# Patient Record
Sex: Female | Born: 1960 | Race: White | Hispanic: No | Marital: Married | Smoking: Former smoker
Health system: Southern US, Community
[De-identification: ages and names within clinical notes are randomized; demographics above are authoritative.]

## PROBLEM LIST (undated history)

## (undated) DIAGNOSIS — R918 Other nonspecific abnormal finding of lung field: Secondary | ICD-10-CM

## (undated) DIAGNOSIS — E119 Type 2 diabetes mellitus without complications: Secondary | ICD-10-CM

## (undated) DIAGNOSIS — E785 Hyperlipidemia, unspecified: Secondary | ICD-10-CM

## (undated) DIAGNOSIS — I10 Essential (primary) hypertension: Secondary | ICD-10-CM

## (undated) HISTORY — DX: Essential (primary) hypertension: I10

## (undated) HISTORY — DX: Hyperlipidemia, unspecified: E78.5

## (undated) HISTORY — PX: ECTOPIC PREGNANCY SURGERY: SHX613

## (undated) HISTORY — DX: Type 2 diabetes mellitus without complications: E11.9

---

## 2019-04-05 ENCOUNTER — Other Ambulatory Visit: Payer: Self-pay

## 2019-04-05 ENCOUNTER — Encounter: Payer: Self-pay | Admitting: Pulmonary Disease

## 2019-04-05 ENCOUNTER — Ambulatory Visit (INDEPENDENT_AMBULATORY_CARE_PROVIDER_SITE_OTHER): Payer: BLUE CROSS/BLUE SHIELD | Admitting: Pulmonary Disease

## 2019-04-05 VITALS — BP 130/70 | HR 108 | Temp 98.2°F | Ht 66.0 in | Wt 148.4 lb

## 2019-04-05 DIAGNOSIS — Z720 Tobacco use: Secondary | ICD-10-CM | POA: Diagnosis not present

## 2019-04-05 DIAGNOSIS — R918 Other nonspecific abnormal finding of lung field: Secondary | ICD-10-CM

## 2019-04-05 DIAGNOSIS — R590 Localized enlarged lymph nodes: Secondary | ICD-10-CM

## 2019-04-05 DIAGNOSIS — C3490 Malignant neoplasm of unspecified part of unspecified bronchus or lung: Secondary | ICD-10-CM

## 2019-04-05 LAB — CBC WITH DIFFERENTIAL/PLATELET
Basophils Absolute: 0.1 10*3/uL (ref 0.0–0.1)
Basophils Relative: 0.6 % (ref 0.0–3.0)
Eosinophils Absolute: 0.1 10*3/uL (ref 0.0–0.7)
Eosinophils Relative: 0.5 % (ref 0.0–5.0)
HCT: 36.1 % (ref 36.0–46.0)
Hemoglobin: 12.3 g/dL (ref 12.0–15.0)
Lymphocytes Relative: 19.7 % (ref 12.0–46.0)
Lymphs Abs: 2.5 10*3/uL (ref 0.7–4.0)
MCHC: 34.2 g/dL (ref 30.0–36.0)
MCV: 87.5 fl (ref 78.0–100.0)
Monocytes Absolute: 0.8 10*3/uL (ref 0.1–1.0)
Monocytes Relative: 6.4 % (ref 3.0–12.0)
Neutro Abs: 9.2 10*3/uL — ABNORMAL HIGH (ref 1.4–7.7)
Neutrophils Relative %: 72.8 % (ref 43.0–77.0)
Platelets: 465 10*3/uL — ABNORMAL HIGH (ref 150.0–400.0)
RBC: 4.12 Mil/uL (ref 3.87–5.11)
RDW: 13.6 % (ref 11.5–15.5)
WBC: 12.6 10*3/uL — ABNORMAL HIGH (ref 4.0–10.5)

## 2019-04-05 LAB — COMPREHENSIVE METABOLIC PANEL
ALT: 11 U/L (ref 0–35)
AST: 12 U/L (ref 0–37)
Albumin: 3.9 g/dL (ref 3.5–5.2)
Alkaline Phosphatase: 113 U/L (ref 39–117)
BUN: 18 mg/dL (ref 6–23)
CO2: 25 mEq/L (ref 19–32)
Calcium: 10.2 mg/dL (ref 8.4–10.5)
Chloride: 100 mEq/L (ref 96–112)
Creatinine, Ser: 0.78 mg/dL (ref 0.40–1.20)
GFR: 75.39 mL/min (ref >60.00)
Glucose, Bld: 258 mg/dL — ABNORMAL HIGH (ref 70–99)
Potassium: 4.3 mEq/L (ref 3.5–5.1)
Sodium: 134 mEq/L — ABNORMAL LOW (ref 135–145)
Total Bilirubin: 0.3 mg/dL (ref 0.2–1.2)
Total Protein: 7.9 g/dL (ref 6.0–8.3)

## 2019-04-05 LAB — APTT: aPTT: 34.1 s — ABNORMAL HIGH (ref 23.4–32.7)

## 2019-04-05 LAB — PROTIME-INR
INR: 1.1 ratio — ABNORMAL HIGH (ref 0.8–1.0)
Prothrombin Time: 12.3 s (ref 9.6–13.1)

## 2019-04-05 NOTE — Progress Notes (Signed)
Synopsis: Referred in March 2021 for abnormal CT imaging, lung mass by Timoteo Gaul, FNP  Subjective:   PATIENT ID: Erika Austin GENDER: female DOB: 1960/11/18, MRN: [id number]  Chief Complaint  Patient presents with  . Consult    Patient is here for lung mass. Patient has shortness of breath with exertion, dry/productive cough with white/yellow sputum and has had no voice for about a month.    This is a 59 year old female longstanding history of smoking since teenager, 40 years of smoking 1 pack/day.  Recently over the past couple of months have lost her voice with voice hoarseness.  She had continued weight loss in conjunction of fatigue and loss of appetite.  Patient had CT scan imaging of the chest completed at [facility] ([place]) which revealed a large left upper lobe mass, right hilar mass involving the aortopulmonary window and associated mediastinal adenopathy and enlarged station 7 node.  Patient was referred to pulmonary medicine for evaluation and tissue diagnosis.  She also has plans for referral to Dr. Lavera Guise at [facility].  She has only had CT imaging at this point no PET scan but no MRI of the brain.  She denies hemoptysis.  She does have family history of lung cancer in her father.    Past Medical History:  Diagnosis Date  . Diabetes ([place])   . Hyperlipidemia   . Hypertension      Family History  Problem Relation Age of Onset  . Colon cancer Mother   . Lung cancer Father   . Heart attack Brother      Past Surgical History:  Procedure Laterality Date  . CESAREAN SECTION    . ECTOPIC PREGNANCY SURGERY      Social History   Socioeconomic History  . Marital status: Married    Spouse name: Not on file  . Number of children: Not on file  . Years of education: Not on file  . Highest education level: Not on file  Occupational History  . Not on file  Tobacco Use  . Smoking status: Former Smoker    Packs/day: 1.00    Years: 40.00   Pack years: 40.00    Types: Cigarettes    Quit date: 03/15/2019    Years since quitting: 0.0  . Smokeless tobacco: Never Used  Substance and Sexual Activity  . Alcohol use: Never  . Drug use: Never  . Sexual activity: Not on file  Other Topics Concern  . Not on file  Social History Narrative  . Not on file   Social Determinants of Health   Financial Resource Strain:   . Difficulty of Paying Living Expenses: Not on file  Food Insecurity:   . Worried About Charity fundraiser in the Last Year: Not on file  . Ran Out of Food in the Last Year: Not on file  Transportation Needs:   . Lack of Transportation (Medical): Not on file  . Lack of Transportation (Non-Medical): Not on file  Physical Activity:   . Days of Exercise per Week: Not on file  . Minutes of Exercise per Session: Not on file  Stress:   . Feeling of Stress : Not on file  Social Connections:   . Frequency of Communication with Friends and Family: Not on file  . Frequency of Social Gatherings with Friends and Family: Not on file  . Attends Religious Services: Not on file  . Active Member of Clubs or Organizations: Not on file  . Attends  Club or Organization Meetings: Not on file  . Marital Status: Not on file  Intimate Partner Violence:   . Fear of Current or Ex-Partner: Not on file  . Emotionally Abused: Not on file  . Physically Abused: Not on file  . Sexually Abused: Not on file     Allergies  Allergen Reactions  . Sulfa Antibiotics Swelling    Tongue swelling     Outpatient Medications Prior to Visit  Medication Sig Dispense Refill  . albuterol (VENTOLIN HFA) 108 (90 Base) MCG/ACT inhaler Inhale 1 puff into the lungs as needed.    Marland Kitchen atorvastatin (LIPITOR) 10 MG tablet Take 10 mg by mouth daily.    Marland Kitchen glimepiride (AMARYL) 2 MG tablet Take 2 mg by mouth every morning.    Marland Kitchen lisinopril-hydrochlorothiazide (ZESTORETIC) 10-12.5 MG tablet Take 1 tablet by mouth daily.    . metFORMIN (GLUCOPHAGE) 1000 MG tablet  Take 1,000 mg by mouth 2 (two) times daily.    . metoprolol succinate (TOPROL-XL) 50 MG 24 hr tablet Take 50 mg by mouth daily.     No facility-administered medications prior to visit.    Review of Systems  Constitutional: Negative for chills, fever, malaise/fatigue and weight loss.  HENT: Negative for hearing loss, sore throat and tinnitus.   Eyes: Negative for blurred vision and double vision.  Respiratory: Positive for shortness of breath and wheezing. Negative for cough, hemoptysis, sputum production and stridor.   Cardiovascular: Negative for chest pain, palpitations, orthopnea, leg swelling and PND.  Gastrointestinal: Negative for abdominal pain, constipation, diarrhea, heartburn, nausea and vomiting.  Genitourinary: Negative for dysuria, hematuria and urgency.  Musculoskeletal: Negative for joint pain and myalgias.  Skin: Negative for itching and rash.  Neurological: Positive for weakness. Negative for dizziness, tingling and headaches.  Endo/Heme/Allergies: Negative for environmental allergies. Does not bruise/bleed easily.  Psychiatric/Behavioral: Positive for depression. The patient is not nervous/anxious and does not have insomnia.   All other systems reviewed and are negative.    Objective:  Physical Exam Vitals reviewed.  Constitutional:      General: She is not in acute distress.    Appearance: She is well-developed.  HENT:     Head: Normocephalic and atraumatic.     Mouth/Throat:     Pharynx: No oropharyngeal exudate.  Eyes:     Conjunctiva/sclera: Conjunctivae normal.     Pupils: Pupils are equal, round, and reactive to light.  Neck:     Vascular: No JVD.     Trachea: No tracheal deviation.     Comments: Loss of supraclavicular fat Cardiovascular:     Rate and Rhythm: Normal rate and regular rhythm.     Heart sounds: S1 normal and S2 normal.     Comments: Distant heart tones Pulmonary:     Effort: No tachypnea or accessory muscle usage.     Breath sounds:  No stridor. Decreased breath sounds (throughout all lung fields) present. No wheezing, rhonchi or rales.  Abdominal:     General: Bowel sounds are normal. There is no distension.     Palpations: Abdomen is soft.     Tenderness: There is no abdominal tenderness.  Musculoskeletal:        General: Deformity (muscle wasting ) present.  Skin:    General: Skin is warm and dry.     Capillary Refill: Capillary refill takes less than 2 seconds.     Findings: No rash.  Neurological:     Mental Status: She is alert and oriented to person,  place, and time.  Psychiatric:        Behavior: Behavior normal.      Vitals:   04/05/19 1428  BP: 130/70  Pulse: (!) 108  Temp: 98.2 F (36.8 C)  TempSrc: Temporal  SpO2: 97%  Weight: 148 lb 6.4 oz (67.3 kg)  Height: 5\' 6"  (1.676 m)   97% on RA BMI Readings from Last 3 Encounters:  04/05/19 23.95 kg/m   Wt Readings from Last 3 Encounters:  04/05/19 148 lb 6.4 oz (67.3 kg)     CBC No results found for: WBC, RBC, HGB, HCT, PLT, MCV, MCH, MCHC, RDW, LYMPHSABS, MONOABS, EOSABS, BASOSABS       Chest Imaging: CT Chest [facility]:  Large LUL obstructing mass and lobar segmental collapse, bulky adenopathy in subcarina.  The patient's images have been independently reviewed by me.    Pulmonary Functions Testing Results: No flowsheet data found.     Assessment & Plan:     ICD-10-CM   1. Mass of upper lobe of left lung  R91.8 Ambulatory referral to Pulmonology    Ambulatory referral to Oncology  2. Mediastinal adenopathy  R59.0 Ambulatory referral to Pulmonology    Ambulatory referral to Oncology  3. Tobacco abuse  Z72.0   4. Malignant neoplasm of unspecified part of unspecified bronchus or lung ([place])  C34.90 MR BRAIN W WO CONTRAST    NM PET Image Initial (PI) Skull Base To Thigh    Discussion: This is a 59 year old former smoker quit at the beginning of the month in February.  40-pack-year history found to have a large left upper lobe  mass occluding anterior segment of the left upper lobe/lingula as well as bulky hilar and mediastinal adenopathy concerning for an advanced stage bronchogenic carcinoma.  Plan Following Extensive Data Review & Interpretation:  . I reviewed prior external note(s) from primary care office Nile Riggs, NP . I reviewed the result(s) of CT imaging from [place], office lab work . I have ordered nuclear medicine pet imaging, MRI brain with contrast, referral [facility]  Today in the office we discussed risk benefits and alternatives of proceeding with invasive diagnostic bronchoscopy.  We discussed the risk of bleeding as well as pneumothorax.  We also discussed the possibility of use of cryotherapy with the opening of the left upper lobe.  Independent interpretation of tests . Review of patient's February 2021 chest imaging images revealed large left upper lobe lesion, bulky hilar and mediastinal adenopathy concerning for a advanced age primary bronchogenic carcinoma. The patient's images have been independently reviewed by me.     Garner Nash, DO [facility] 04/05/2019 2:56 PM

## 2019-04-05 NOTE — H&P (View-Only) (Signed)
Synopsis: Referred in March 2021 for abnormal CT imaging, lung mass by Timoteo Gaul, FNP  Subjective:   PATIENT ID: Erika Austin GENDER: female DOB: [birth year], MRN: [id number]  Chief Complaint  Patient presents with  . Consult    Patient is here for lung mass. Patient has shortness of breath with exertion, dry/productive cough with white/yellow sputum and has had no voice for about a month.    This is a 59 year old female longstanding history of smoking since teenager, 40 years of smoking 1 pack/day.  Recently over the past couple of months have lost her voice with voice hoarseness.  She had continued weight loss in conjunction of fatigue and loss of appetite.  Patient had CT scan imaging of the chest completed at [facility] which revealed a large left upper lobe mass, right hilar mass involving the aortopulmonary window and associated mediastinal adenopathy and enlarged station 7 node.  Patient was referred to pulmonary medicine for evaluation and tissue diagnosis.  She also has plans for referral to Dr. Lavera Guise at [facility].  She has only had CT imaging at this point no PET scan but no MRI of the brain.  She denies hemoptysis.  She does have family history of lung cancer in her father.    Past Medical History:  Diagnosis Date  . Diabetes ([place])   . Hyperlipidemia   . Hypertension      Family History  Problem Relation Age of Onset  . Colon cancer Mother   . Lung cancer Father   . Heart attack Brother      Past Surgical History:  Procedure Laterality Date  . CESAREAN SECTION    . ECTOPIC PREGNANCY SURGERY      Social History   Socioeconomic History  . Marital status: Married    Spouse name: Not on file  . Number of children: Not on file  . Years of education: Not on file  . Highest education level: Not on file  Occupational History  . Not on file  Tobacco Use  . Smoking status: Former Smoker    Packs/day: 1.00    Years: 40.00   Pack years: 40.00    Types: Cigarettes    Quit date: 03/15/2019    Years since quitting: 0.0  . Smokeless tobacco: Never Used  Substance and Sexual Activity  . Alcohol use: Never  . Drug use: Never  . Sexual activity: Not on file  Other Topics Concern  . Not on file  Social History Narrative  . Not on file   Social Determinants of Health   Financial Resource Strain:   . Difficulty of Paying Living Expenses: Not on file  Food Insecurity:   . Worried About Charity fundraiser in the Last Year: Not on file  . Ran Out of Food in the Last Year: Not on file  Transportation Needs:   . Lack of Transportation (Medical): Not on file  . Lack of Transportation (Non-Medical): Not on file  Physical Activity:   . Days of Exercise per Week: Not on file  . Minutes of Exercise per Session: Not on file  Stress:   . Feeling of Stress : Not on file  Social Connections:   . Frequency of Communication with Friends and Family: Not on file  . Frequency of Social Gatherings with Friends and Family: Not on file  . Attends Religious Services: Not on file  . Active Member of Clubs or Organizations: Not on file  . Attends  Club or Organization Meetings: Not on file  . Marital Status: Not on file  Intimate Partner Violence:   . Fear of Current or Ex-Partner: Not on file  . Emotionally Abused: Not on file  . Physically Abused: Not on file  . Sexually Abused: Not on file     Allergies  Allergen Reactions  . Sulfa Antibiotics Swelling    Tongue swelling     Outpatient Medications Prior to Visit  Medication Sig Dispense Refill  . albuterol (VENTOLIN HFA) 108 (90 Base) MCG/ACT inhaler Inhale 1 puff into the lungs as needed.    Marland Kitchen atorvastatin (LIPITOR) 10 MG tablet Take 10 mg by mouth daily.    Marland Kitchen glimepiride (AMARYL) 2 MG tablet Take 2 mg by mouth every morning.    Marland Kitchen lisinopril-hydrochlorothiazide (ZESTORETIC) 10-12.5 MG tablet Take 1 tablet by mouth daily.    . metFORMIN (GLUCOPHAGE) 1000 MG tablet  Take 1,000 mg by mouth 2 (two) times daily.    . metoprolol succinate (TOPROL-XL) 50 MG 24 hr tablet Take 50 mg by mouth daily.     No facility-administered medications prior to visit.    Review of Systems  Constitutional: Negative for chills, fever, malaise/fatigue and weight loss.  HENT: Negative for hearing loss, sore throat and tinnitus.   Eyes: Negative for blurred vision and double vision.  Respiratory: Positive for shortness of breath and wheezing. Negative for cough, hemoptysis, sputum production and stridor.   Cardiovascular: Negative for chest pain, palpitations, orthopnea, leg swelling and PND.  Gastrointestinal: Negative for abdominal pain, constipation, diarrhea, heartburn, nausea and vomiting.  Genitourinary: Negative for dysuria, hematuria and urgency.  Musculoskeletal: Negative for joint pain and myalgias.  Skin: Negative for itching and rash.  Neurological: Positive for weakness. Negative for dizziness, tingling and headaches.  Endo/Heme/Allergies: Negative for environmental allergies. Does not bruise/bleed easily.  Psychiatric/Behavioral: Positive for depression. The patient is not nervous/anxious and does not have insomnia.   All other systems reviewed and are negative.    Objective:  Physical Exam Vitals reviewed.  Constitutional:      General: She is not in acute distress.    Appearance: She is well-developed.  HENT:     Head: Normocephalic and atraumatic.     Mouth/Throat:     Pharynx: No oropharyngeal exudate.  Eyes:     Conjunctiva/sclera: Conjunctivae normal.     Pupils: Pupils are equal, round, and reactive to light.  Neck:     Vascular: No JVD.     Trachea: No tracheal deviation.     Comments: Loss of supraclavicular fat Cardiovascular:     Rate and Rhythm: Normal rate and regular rhythm.     Heart sounds: S1 normal and S2 normal.     Comments: Distant heart tones Pulmonary:     Effort: No tachypnea or accessory muscle usage.     Breath sounds:  No stridor. Decreased breath sounds (throughout all lung fields) present. No wheezing, rhonchi or rales.  Abdominal:     General: Bowel sounds are normal. There is no distension.     Palpations: Abdomen is soft.     Tenderness: There is no abdominal tenderness.  Musculoskeletal:        General: Deformity (muscle wasting ) present.  Skin:    General: Skin is warm and dry.     Capillary Refill: Capillary refill takes less than 2 seconds.     Findings: No rash.  Neurological:     Mental Status: She is alert and oriented to person,  place, and time.  Psychiatric:        Behavior: Behavior normal.      Vitals:   04/05/19 1428  BP: 130/70  Pulse: (!) 108  Temp: 98.2 F (36.8 C)  TempSrc: Temporal  SpO2: 97%  Weight: 148 lb 6.4 oz (67.3 kg)  Height: 5\' 6"  (1.676 m)   97% on RA BMI Readings from Last 3 Encounters:  04/05/19 23.95 kg/m   Wt Readings from Last 3 Encounters:  04/05/19 148 lb 6.4 oz (67.3 kg)     CBC No results found for: WBC, RBC, HGB, HCT, PLT, MCV, MCH, MCHC, RDW, LYMPHSABS, MONOABS, EOSABS, BASOSABS       Chest Imaging: CT Chest [facility]:  Large LUL obstructing mass and lobar segmental collapse, bulky adenopathy in subcarina.  The patient's images have been independently reviewed by me.    Pulmonary Functions Testing Results: No flowsheet data found.     Assessment & Plan:     ICD-10-CM   1. Mass of upper lobe of left lung  R91.8 Ambulatory referral to Pulmonology    Ambulatory referral to Oncology  2. Mediastinal adenopathy  R59.0 Ambulatory referral to Pulmonology    Ambulatory referral to Oncology  3. Tobacco abuse  Z72.0   4. Malignant neoplasm of unspecified part of unspecified bronchus or lung ([place])  C34.90 MR BRAIN W WO CONTRAST    NM PET Image Initial (PI) Skull Base To Thigh    Discussion: This is a 59 year old former smoker quit at the beginning of the month in February.  40-pack-year history found to have a large left upper lobe  mass occluding anterior segment of the left upper lobe/lingula as well as bulky hilar and mediastinal adenopathy concerning for an advanced stage bronchogenic carcinoma.  Plan Following Extensive Data Review & Interpretation:  . I reviewed prior external note(s) from primary care office Nile Riggs, NP . I reviewed the result(s) of CT imaging from [place], office lab work . I have ordered nuclear medicine pet imaging, MRI brain with contrast, referral [facility]  Today in the office we discussed risk benefits and alternatives of proceeding with invasive diagnostic bronchoscopy.  We discussed the risk of bleeding as well as pneumothorax.  We also discussed the possibility of use of cryotherapy with the opening of the left upper lobe.  Independent interpretation of tests . Review of patient's February 2021 chest imaging images revealed large left upper lobe lesion, bulky hilar and mediastinal adenopathy concerning for a advanced age primary bronchogenic carcinoma. The patient's images have been independently reviewed by me.     Garner Nash, DO [facility] [facility] 04/05/2019 2:56 PM

## 2019-04-05 NOTE — Patient Instructions (Addendum)
Thank you for visiting Dr. Valeta Harms at [facility]. Today we recommend the following:  Orders Placed This Encounter  Procedures  . MR BRAIN W WO CONTRAST  . NM PET Image Initial (PI) Skull Base To Thigh  . Ambulatory referral to Pulmonology  . Ambulatory referral to Oncology   Preop labs. Bronchoscopy to be scheduled on 04/11/2019 [place] endoscopy. Covid testing to be scheduled preop.  Return in about 4 weeks (around 05/03/2019).    Please do your part to reduce the spread of COVID-19.

## 2019-04-07 NOTE — Progress Notes (Signed)
Patient denies shortness of breath, fever, cough and chest pain.  PCP - Dr Rex Kras Cardiologist - denies  Chest x-ray - denies EKG - DOS, 04/11/19 Stress Test - denies ECHO - denies Cardiac Cath - denies  Fasting Blood Sugar - 100-120s Checks Blood Sugar 1 times a day  . Do not take oral diabetes medicines (Glimepirideand Metformin) the morning of surgery.  . If your blood sugar is less than 70 mg/dL, you will need to treat for low blood sugar: o Treat a low blood sugar (less than 70 mg/dL) with  cup of clear juice (cranberry or apple), 4 glucose tablets, OR glucose gel. o Recheck blood sugar in 15 minutes after treatment (to make sure it is greater than 70 mg/dL). If your blood sugar is not greater than 70 mg/dL on recheck, call [phone] for further instructions.  Aspirin Instructions:  Last dose on Sat., 04/08/19.  Anesthesia review:  No  STOP taking any Aspirin (unless otherwise instructed by your surgeon), Aleve, Naproxen, Ibuprofen, Motrin, Advil, Goody's, BC's, all herbal medications, fish oil, and all vitamins.   Coronavirus Screening Covid test on Sat, 04/08/19 at 11:45 A.M. was negative.  Patient verbalized understanding of instructions that were given via phone.

## 2019-04-08 ENCOUNTER — Other Ambulatory Visit (HOSPITAL_COMMUNITY)
Admission: RE | Admit: 2019-04-08 | Discharge: 2019-04-08 | Disposition: A | Discharge status: Home / Self Care | Payer: BLUE CROSS/BLUE SHIELD | Source: Ambulatory Visit | Attending: Pulmonary Disease | Admitting: Pulmonary Disease

## 2019-04-08 DIAGNOSIS — Z01812 Encounter for preprocedural laboratory examination: Secondary | ICD-10-CM | POA: Insufficient documentation

## 2019-04-08 DIAGNOSIS — Z20822 Contact with and (suspected) exposure to covid-19: Secondary | ICD-10-CM | POA: Insufficient documentation

## 2019-04-08 LAB — SARS CORONAVIRUS 2 (TAT 6-24 HRS): SARS Coronavirus 2: NEGATIVE

## 2019-04-10 ENCOUNTER — Encounter (HOSPITAL_COMMUNITY): Payer: Self-pay | Admitting: Pulmonary Disease

## 2019-04-10 ENCOUNTER — Other Ambulatory Visit: Payer: Self-pay

## 2019-04-11 ENCOUNTER — Other Ambulatory Visit: Payer: Self-pay

## 2019-04-11 ENCOUNTER — Ambulatory Visit (HOSPITAL_COMMUNITY): Payer: BLUE CROSS/BLUE SHIELD | Admitting: Physician Assistant

## 2019-04-11 ENCOUNTER — Encounter (HOSPITAL_COMMUNITY)
Admission: RE | Disposition: A | Discharge status: Home / Self Care | Payer: Self-pay | Source: Home / Self Care | Attending: Pulmonary Disease

## 2019-04-11 ENCOUNTER — Ambulatory Visit (HOSPITAL_COMMUNITY)
Admission: RE | Admit: 2019-04-11 | Discharge: 2019-04-11 | Disposition: A | Discharge status: Home / Self Care | Payer: BLUE CROSS/BLUE SHIELD | Attending: Pulmonary Disease | Admitting: Pulmonary Disease

## 2019-04-11 ENCOUNTER — Encounter (HOSPITAL_COMMUNITY): Payer: Self-pay | Admitting: Pulmonary Disease

## 2019-04-11 DIAGNOSIS — Z87891 Personal history of nicotine dependence: Secondary | ICD-10-CM | POA: Diagnosis not present

## 2019-04-11 DIAGNOSIS — Z7984 Long term (current) use of oral hypoglycemic drugs: Secondary | ICD-10-CM | POA: Diagnosis not present

## 2019-04-11 DIAGNOSIS — E119 Type 2 diabetes mellitus without complications: Secondary | ICD-10-CM | POA: Diagnosis not present

## 2019-04-11 DIAGNOSIS — E785 Hyperlipidemia, unspecified: Secondary | ICD-10-CM | POA: Diagnosis not present

## 2019-04-11 DIAGNOSIS — C3492 Malignant neoplasm of unspecified part of left bronchus or lung: Secondary | ICD-10-CM | POA: Diagnosis not present

## 2019-04-11 DIAGNOSIS — I10 Essential (primary) hypertension: Secondary | ICD-10-CM | POA: Insufficient documentation

## 2019-04-11 DIAGNOSIS — R9431 Abnormal electrocardiogram [ECG] [EKG]: Secondary | ICD-10-CM | POA: Diagnosis not present

## 2019-04-11 DIAGNOSIS — C3412 Malignant neoplasm of upper lobe, left bronchus or lung: Secondary | ICD-10-CM | POA: Diagnosis present

## 2019-04-11 DIAGNOSIS — Z79899 Other long term (current) drug therapy: Secondary | ICD-10-CM | POA: Insufficient documentation

## 2019-04-11 DIAGNOSIS — R918 Other nonspecific abnormal finding of lung field: Secondary | ICD-10-CM | POA: Diagnosis not present

## 2019-04-11 HISTORY — PX: BRONCHIAL WASHINGS: SHX5105

## 2019-04-11 HISTORY — PX: BRONCHIAL BIOPSY: SHX5109

## 2019-04-11 HISTORY — PX: VIDEO BRONCHOSCOPY: SHX5072

## 2019-04-11 HISTORY — PX: HEMOSTASIS CONTROL: SHX6838

## 2019-04-11 HISTORY — DX: Other nonspecific abnormal finding of lung field: R91.8

## 2019-04-11 HISTORY — PX: BALLOON DILATION: SHX5330

## 2019-04-11 LAB — GLUCOSE, CAPILLARY
Glucose-Capillary: 162 mg/dL — ABNORMAL HIGH (ref 70–99)
Glucose-Capillary: 172 mg/dL — ABNORMAL HIGH (ref 70–99)

## 2019-04-11 SURGERY — BRONCHOSCOPY, VIDEO-ASSISTED
Anesthesia: General | Laterality: Left

## 2019-04-11 MED ORDER — PHENYLEPHRINE 40 MCG/ML (10ML) SYRINGE FOR IV PUSH (FOR BLOOD PRESSURE SUPPORT)
PREFILLED_SYRINGE | INTRAVENOUS | Status: DC | PRN
  Administered 2019-04-11: 80 ug via INTRAVENOUS
  Administered 2019-04-11: 40 ug via INTRAVENOUS
  Administered 2019-04-11: 80 ug via INTRAVENOUS

## 2019-04-11 MED ORDER — FENTANYL CITRATE (PF) 250 MCG/5ML IJ SOLN
INTRAMUSCULAR | Status: DC | PRN
  Administered 2019-04-11: 100 ug via INTRAVENOUS

## 2019-04-11 MED ORDER — PROPOFOL 10 MG/ML IV BOLUS
INTRAVENOUS | Status: DC | PRN
  Administered 2019-04-11: 160 mg via INTRAVENOUS

## 2019-04-11 MED ORDER — LIDOCAINE 2% (20 MG/ML) 5 ML SYRINGE
INTRAMUSCULAR | Status: DC | PRN
  Administered 2019-04-11: 60 mg via INTRAVENOUS

## 2019-04-11 MED ORDER — LACTATED RINGERS IV SOLN: INTRAVENOUS | Status: DC

## 2019-04-11 MED ORDER — SODIUM CHLORIDE (PF) 0.9 % IJ SOLN
PREFILLED_SYRINGE | INTRAMUSCULAR | Status: DC | PRN
  Administered 2019-04-11: 2 mL

## 2019-04-11 MED ORDER — MIDAZOLAM HCL 5 MG/5ML IJ SOLN
INTRAMUSCULAR | Status: DC | PRN
  Administered 2019-04-11: 2 mg via INTRAVENOUS

## 2019-04-11 MED ORDER — SUGAMMADEX SODIUM 200 MG/2ML IV SOLN
INTRAVENOUS | Status: DC | PRN
  Administered 2019-04-11: 200 mg via INTRAVENOUS

## 2019-04-11 MED ORDER — ONDANSETRON HCL 4 MG/2ML IJ SOLN
INTRAMUSCULAR | Status: DC | PRN
  Administered 2019-04-11: 4 mg via INTRAVENOUS

## 2019-04-11 MED ORDER — DEXAMETHASONE SODIUM PHOSPHATE 10 MG/ML IJ SOLN
INTRAMUSCULAR | Status: DC | PRN
  Administered 2019-04-11: 4 mg via INTRAVENOUS

## 2019-04-11 MED ORDER — ROCURONIUM BROMIDE 10 MG/ML (PF) SYRINGE
PREFILLED_SYRINGE | INTRAVENOUS | Status: DC | PRN
  Administered 2019-04-11: 60 mg via INTRAVENOUS

## 2019-04-11 MED ORDER — PHENYLEPHRINE HCL-NACL 10-0.9 MG/250ML-% IV SOLN
INTRAVENOUS | Status: DC | PRN
  Administered 2019-04-11: 30 ug/min via INTRAVENOUS

## 2019-04-11 SURGICAL SUPPLY — 29 items

## 2019-04-11 NOTE — Anesthesia Procedure Notes (Signed)
Procedure Name: Intubation Date/Time: 04/11/2019 11:41 AM Performed by: Myna Bright, CRNA Pre-anesthesia Checklist: Patient identified, Emergency Drugs available, Suction available and Patient being monitored Patient Re-evaluated:Patient Re-evaluated prior to induction Oxygen Delivery Method: Circle system utilized Preoxygenation: Pre-oxygenation with 100% oxygen Induction Type: IV induction Ventilation: Mask ventilation without difficulty Laryngoscope Size: Mac and 3 Grade View: Grade I Tube type: Oral Tube size: 8.5 mm Number of attempts: 1 Airway Equipment and Method: Stylet Placement Confirmation: ETT inserted through vocal cords under direct vision,  positive ETCO2 and breath sounds checked- equal and bilateral Secured at: 21 cm Tube secured with: Tape Dental Injury: Teeth and Oropharynx as per pre-operative assessment

## 2019-04-11 NOTE — Interval H&P Note (Signed)
History and Physical Interval Note:  04/11/2019 11:09 AM  Erika Austin  has presented today for surgery, with the diagnosis of upper left lobe lung mass.  The various methods of treatment have been discussed with the patient and family. After consideration of risks, benefits and other options for treatment, the patient has consented to  Procedure(s): [place] (Left) as a surgical intervention.  The patient's history has been reviewed, patient examined, no change in status, stable for surgery.  I have reviewed the patient's chart and labs.  Questions were answered to the patient's satisfaction.    Patient seen in pre-op. All questions answered. No barriers to proceed. Consent freely sign.   [place]

## 2019-04-11 NOTE — Op Note (Addendum)
Video Bronchoscopy with endobronchial biopsies, endobronchial cryotherapy, endobronchial cryo biopsies, tumor excision and relief of obstruction procedure Note  Date of Operation: 04/11/2019  Pre-op Diagnosis: Lung mass, endobronchial tumor, endobronchial obstruction  Post-op Diagnosis: Lung mass, endobronchial tumor, endobronchial obstruction  Surgeon: Garner Nash, DO   Assistants: none  Anesthesia: General anesthesia per anesthesia record.  Meds Given: General anesthesia   Operation: Flexible video fiberoptic bronchoscopy and biopsies.  Estimated Blood Loss: <7QB  Complications: none noted  Indications and History: Erika Austin is 59 y.o. with history of lung mass, endobronchial obstruction, endobronchial tumor.  Recommendation was to perform video fiberoptic bronchoscopy with biopsies. The risks, benefits, complications, treatment options and expected outcomes were discussed with the patient.  The possibilities of pneumothorax, pneumonia, reaction to medication, pulmonary aspiration, perforation of a viscus, bleeding, failure to diagnose a condition and creating a complication requiring transfusion or operation were discussed with the patient who freely signed the consent.    Description of Procedure: The patient was seen in the Preoperative Area, was examined and was deemed appropriate to proceed.  The patient was taken to endoscopy [facility] room 2, identified as Erika Austin and the procedure verified as Flexible Video Fiberoptic Bronchoscopy.  A Time Out was held and the above information confirmed.   A endotracheal tube was placed by anesthesia.  The flexible therapeutic bronchoscope was introduced into the airway.  There was tumor involvement of the bilateral medial portions of the main carina.  Most heavy tumor burden within the left mainstem.  The [place] to cryotherapy probe was used for tumor debulking, excision and cryotherapy along the medial wall from the base of the  tumor.  Initially starting with a 80% occlusion of the left.  Initially I was able to pass the therapeutic bronchoscope on the lateral border into the left lung.  The left upper lobe was totally occluded with endobronchial tumor as well as extrinsic compression of the opening of the lower lobe.  Following multiple freeze thaw cycles and extraction of tumor en bloc with the cryotherapy probe the obstruction was relieved.  We also used [employer] forceps to remove select free hanging pieces of tissue from the left mainstem.  A 8/10 mm [employer] airway balloon was deployed into the left mainstem.  Balloon was filled to 8 mm for 2 separate 30-second dilatation periods.  Between each dilatation.  The airway was inspected for any laceration or tears.  The balloon was reinserted and inflated to 9 mm.  Also again with 2 separate 32nd dilatation.'s.  Between each dilatation the airway was inspected for any laceration or tears.  There was no active bleeding.  The cryotherapy probe was then used for 30 seconds freeze thaw cycles of the entire bed of tumor growth along the lateral posterior and medial airway wall.  Additionally tumor involvement of the right mainstem received cryotherapy treatments just at the upper takeoff.  At the end of the procedure there was no evidence of active bleeding.  The bronchoscope was used for therapeutic suctioning of blood clots and secretions from both mainstem's.  The bronchoscope was returned to just above the main carina and there was no evidence of active bleeding.  The bronchoscope was removed the patient's airway.  Samples: 1.  Endobronchial forcep biopsies 2.  Endobronchial cryo biopsies  Plans:  We will review the cytology, pathology results with the patient when they become available.  Outpatient followup will be with Garner Nash, DO   Recommendations: Plan for repeat  bronchoscopy and cryotherapy to endobronchial tumor in 2 weeks.  [facility] 04/11/2019 1:18 PM

## 2019-04-11 NOTE — Anesthesia Preprocedure Evaluation (Addendum)
Anesthesia Evaluation  Patient identified by MRN, date of birth, ID band Patient awake    Reviewed: Allergy & Precautions, NPO status , Patient's Chart, lab work & pertinent test results, reviewed documented beta blocker date and time   History of Anesthesia Complications Negative for: history of anesthetic complications  Airway Mallampati: II  TM Distance: >3 FB Neck ROM: Full    Dental  (+) Teeth Intact   Pulmonary former smoker,  Lung mass   Pulmonary exam normal        Cardiovascular hypertension, Pt. on medications and Pt. on home beta blockers Normal cardiovascular exam     Neuro/Psych negative neurological ROS  negative psych ROS   GI/Hepatic negative GI ROS, Neg liver ROS,   Endo/Other  diabetes, Type 2, Oral Hypoglycemic Agents  Renal/GU negative Renal ROS  negative genitourinary   Musculoskeletal negative musculoskeletal ROS (+)   Abdominal   Peds  Hematology negative hematology ROS (+)   Anesthesia Other Findings   Reproductive/Obstetrics                            Anesthesia Physical Anesthesia Plan  ASA: III  Anesthesia Plan: General   Post-op Pain Management:    Induction: Intravenous  PONV Risk Score and Plan: 3 and Ondansetron, Dexamethasone, Treatment may vary due to age or medical condition and Midazolam  Airway Management Planned: Oral ETT  Additional Equipment: None  Intra-op Plan:   Post-operative Plan: Extubation in OR  Informed Consent: I have reviewed the patients History and Physical, chart, labs and discussed the procedure including the risks, benefits and alternatives for the proposed anesthesia with the patient or authorized representative who has indicated his/her understanding and acceptance.     Dental advisory given  Plan Discussed with:   Anesthesia Plan Comments:        Anesthesia Quick Evaluation

## 2019-04-11 NOTE — Transfer of Care (Signed)
Immediate Anesthesia Transfer of Care Note  Patient: Erika Austin  Procedure(s) Performed: VIDEO BRONCHOSCOPY BRONCHIAL WASHINGS BRONCHIAL BIOPSIES HEMOSTASIS CONTROL BALLOON DILATION  Patient Location: PACU  Anesthesia Type:General  Level of Consciousness: awake, alert  and oriented  Airway & Oxygen Therapy: Patient Spontanous Breathing and Patient connected to face mask oxygen  Post-op Assessment: Report given to RN, Post -op Vital signs reviewed and stable and Patient moving all extremities X 4  Post vital signs: Reviewed and stable  Last Vitals:  Vitals Value Taken Time  BP 138/88 04/11/19 1331  Temp    Pulse 91 04/11/19 1336  Resp 21 04/11/19 1336  SpO2 97 % 04/11/19 1336  Vitals shown include unvalidated device data.  Last Pain:  Vitals:   04/11/19 0901  TempSrc:   PainSc: 0-No pain      Patients Stated Pain Goal: 3 (69/79/48 0165)  Complications: No apparent anesthesia complications

## 2019-04-11 NOTE — Discharge Instructions (Signed)
Flexible Bronchoscopy, Care After This sheet gives you information about how to care for yourself after your test. Your doctor may also give you more specific instructions. If you have problems or questions, contact your doctor. Follow these instructions at home: Eating and drinking  The day after the test, go back to your normal diet. Driving  Do not drive for 24 hours if you were given a medicine to help you relax (sedative).  Do not drive or use heavy machinery while taking prescription pain medicine. General instructions   Take over-the-counter and prescription medicines only as told by your doctor.  Return to your normal activities as told. Ask what activities are safe for you.  Do not use any products that have nicotine or tobacco in them. This includes cigarettes and e-cigarettes. If you need help quitting, ask your doctor.  Keep all follow-up visits as told by your doctor. This is important. It is very important if you had a tissue sample (biopsy) taken. Get help right away if:  You have shortness of breath that gets worse.  You get light-headed.  You feel like you are going to pass out (faint).  You have chest pain.  You cough up: ? More than a little blood. ? More blood than before. Summary  Do not eat or drink anything (not even water) for 2 hours after your test, or until your numbing medicine wears off.  Do not use cigarettes. Do not use e-cigarettes.  Get help right away if you have chest pain. This information is not intended to replace advice given to you by your health care provider. Make sure you discuss any questions you have with your health care provider. Document Revised: 01/01/2017 Document Reviewed: 02/07/2016 Elsevier Patient Education  [place].   Repeat Bronchoscopy scheduled for 3/23 at 0730 at [facility].

## 2019-04-12 ENCOUNTER — Encounter: Payer: Self-pay | Admitting: *Deleted

## 2019-04-12 LAB — CYTOLOGY - NON PAP

## 2019-04-12 NOTE — Anesthesia Postprocedure Evaluation (Signed)
Anesthesia Post Note  Patient: Erika Austin  Procedure(s) Performed: VIDEO BRONCHOSCOPY BRONCHIAL WASHINGS BRONCHIAL BIOPSIES HEMOSTASIS CONTROL BALLOON DILATION     Patient location during evaluation: PACU Anesthesia Type: General Level of consciousness: awake and alert Pain management: pain level controlled Vital Signs Assessment: post-procedure vital signs reviewed and stable Respiratory status: spontaneous breathing, nonlabored ventilation and respiratory function stable Cardiovascular status: blood pressure returned to baseline and stable Postop Assessment: no apparent nausea or vomiting Anesthetic complications: no    Last Vitals:  Vitals:   04/11/19 1430 04/11/19 1454  BP: (!) 147/63 (!) 161/81  Pulse: 90 89  Resp: (!) 24 20  Temp: 36.6 C   SpO2: 96% 96%    Last Pain:  Vitals:   04/11/19 1454  TempSrc:   PainSc: 0-No pain                 Lidia Collum

## 2019-04-13 LAB — SURGICAL PATHOLOGY

## 2019-04-17 ENCOUNTER — Telehealth: Payer: Self-pay

## 2019-04-17 ENCOUNTER — Ambulatory Visit (HOSPITAL_COMMUNITY)
Admission: RE | Admit: 2019-04-17 | Discharge: 2019-04-17 | Disposition: A | Discharge status: Home / Self Care | Payer: BLUE CROSS/BLUE SHIELD | Source: Ambulatory Visit | Attending: Pulmonary Disease | Admitting: Pulmonary Disease

## 2019-04-17 ENCOUNTER — Other Ambulatory Visit: Payer: Self-pay | Admitting: Pulmonary Disease

## 2019-04-17 ENCOUNTER — Other Ambulatory Visit: Payer: Self-pay

## 2019-04-17 DIAGNOSIS — C3490 Malignant neoplasm of unspecified part of unspecified bronchus or lung: Secondary | ICD-10-CM | POA: Insufficient documentation

## 2019-04-17 LAB — GLUCOSE, CAPILLARY: Glucose-Capillary: 175 mg/dL — ABNORMAL HIGH (ref 70–99)

## 2019-04-17 IMAGING — PT NM PET TUM IMG INITIAL (PI) SKULL BASE T - THIGH
1 of 7 series · 1 of 25 positions shown · non-contrast
Comparison: Chest CT [DATE]

CLINICAL DATA: Initial treatment strategy for non-small-cell lung
cancer.

EXAM:
NUCLEAR MEDICINE PET SKULL BASE TO THIGH
TECHNIQUE: 7.2 mCi F-18 FDG was injected intravenously. Full-ring PET imaging
was performed from the skull base to thigh after the radiotracer. CT
data was obtained and used for attenuation correction and anatomic
localization.
Fasting blood glucose: 175 mg/dl

[Series 4: ct sk_thigh 5.0 b31f · axial · 5.0mm · 0.98mm/px · 1 of 211 slices shown]
[im 211/211  brain]
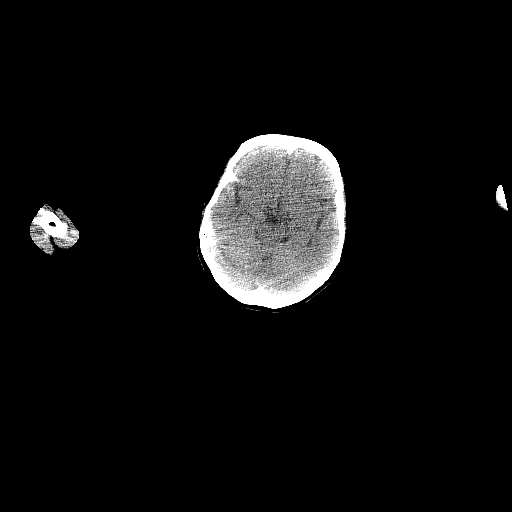

[1 of 25 positions shown; findings below may reference images not displayed]

FINDINGS: Mediastinal blood pool activity: SUV max

Liver activity: SUV max NA

NECK: No hypermetabolic lymph nodes in the neck.

Incidental CT findings: None.

CHEST: Large left hilar/parahilar mass is markedly hypermetabolic
with SUV max = 13. Necrotic subcarinal node is hypermetabolic with
SUV max = 8. AP window hypermetabolic lymphadenopathy demonstrates
SUV max = 7.4.

No evidence for hypermetabolic metastatic disease in the right
hilum, axillary regions, or supraclavicular spaces.

Incidental CT findings: There is abdominal aortic atherosclerosis
without aneurysm. Coronary artery calcification is evident.
Centrilobular emphsyema noted. Left upper lobe collapse again noted.

ABDOMEN/PELVIS: No abnormal hypermetabolic activity within the
liver, pancreas, adrenal glands, or spleen. No hypermetabolic lymph
nodes in the abdomen or pelvis.

Incidental CT findings: Tiny calcified gallstones evident. There is
abdominal aortic atherosclerosis without aneurysm.

SKELETON: No focal hypermetabolic activity to suggest skeletal
metastasis.

Incidental CT findings: No worrisome lytic or sclerotic osseous
abnormality.
IMPRESSION: Markedly hypermetabolic central left lung mass with associated left
upper lobe collapse. Imaging features compatible with the patient's
known history of primary malignancy.

Hypermetabolic metastatic lymphadenopathy in the AP window, left
hilum, and subcarinal station.

No evidence for hypermetabolic metastatic disease in the neck,
abdomen, or pelvis.

Cholelithiasis.

Emphysema. ([AM]-[AM]) Aortic Atherosclerois ([AM]-170.0)

## 2019-04-17 MED ORDER — DEXAMETHASONE 4 MG PO TABS: 4.0000 mg | ORAL_TABLET | Freq: Two times a day (BID) | ORAL | 0 refills | Status: AC

## 2019-04-17 MED ORDER — FLUDEOXYGLUCOSE F - 18 (FDG) INJECTION
7.2000 | Freq: Once | INTRAVENOUS | Status: AC | PRN
  Administered 2019-04-17: 13:00:00 7.2 via INTRAVENOUS

## 2019-04-17 NOTE — Telephone Encounter (Signed)
Forwarding to Dr. Valeta Harms

## 2019-04-17 NOTE — Progress Notes (Signed)
Patient was scheduled for Brain MRI wo/w contrast. Patient stated her exam on "her paper" was without contrast. I verified her order in Epic and explained that the MD did indeed order her exam with AND without contrast. She continued  to state that her paper stated without contrast and she did not want the contrast.  Clinical education provided to the pt on the importance of contrast administration for her exam and that the MD may recommend that she return for contrast   Pt continued to decline contrast.   bhj

## 2019-04-17 NOTE — Telephone Encounter (Signed)
PCCM:  Please send prescription for 4mg  oral decadron twice daily.   Also, please send report to Dr. Bobby Rumpf office in [place] center.   Garner Nash, DO [facility] 04/17/2019 3:22 PM

## 2019-04-17 NOTE — Telephone Encounter (Signed)
Dr. Standley Brooking, has pt been contacted to discuss results and recs? If not, please advise if a provider will call or if a nurse needs to call.   Will need to contact pt to inform her of the Decadron and why it is being given.

## 2019-04-17 NOTE — Telephone Encounter (Signed)
Thanks, just looked for PET results. If she call back tomorrow I can speak with her Daneil Dan

## 2019-04-17 NOTE — Telephone Encounter (Signed)
See above message

## 2019-04-17 NOTE — Progress Notes (Signed)
I routed to Dr Lavera Guise via Epic routing  I called the pt but she did not answer- VM not set up yet

## 2019-04-17 NOTE — Telephone Encounter (Signed)
Thanks [employer]. Decadron 4mg  has been sent to only preferred pharmacy in pt's chart ([facility] on [address] in [place]). Brain MRI sent via Epic to Dr. Lavera Guise.   Will forward to [facility] and Dr. Valeta Harms.

## 2019-04-17 NOTE — Telephone Encounter (Signed)
PCCM:  I have attempted to call the patient twice today with no answer.  We will see if we can get a hold of her tomorrow.  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  If patient is having any symptomatology would recommend evaluation in the ED if we are able to get a hold of her.  [facility] 04/17/2019 4:55 PM

## 2019-04-17 NOTE — Telephone Encounter (Signed)
[facility] radiology called with a call report on MRI Brain obtained today.  Pt is also receiving a PET today but this is still underway.  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 There is mass-effect on the fourth ventricle however there is no hydrocephalus  The patient refused intravenous contrast. No definite second metastatic deposit. Scattered small white matter hyperintensities bilaterally likely are chronic microvascular ischemia however without intravenous contrast cannot exclude small metastatic Deposits.  Sending to APP since Dr. Valeta Harms is not in office.  Beth please advise if this is something that can wait for Dr. Randalyn Rhea.  Thanks!

## 2019-04-17 NOTE — Telephone Encounter (Signed)
I would recommend waiting for the PET scan results to come back and then I can call them if Dr. Valeta Harms is unable to. I am not familiar with patient or case and will need to look into their chart- I can do after the end of the day

## 2019-04-17 NOTE — Telephone Encounter (Signed)
Thanks  Garner Nash, DO [facility] 04/17/2019 5:14 PM

## 2019-04-19 NOTE — Telephone Encounter (Signed)
ATC pt. VM box has not been set up yet. WCB to assure she has picked up the decadron.

## 2019-04-20 ENCOUNTER — Other Ambulatory Visit: Payer: Self-pay | Admitting: *Deleted

## 2019-04-20 NOTE — Progress Notes (Signed)
The proposed treatment discussed in cancer conference 04/20/19 is for discussion purpose only and is not a binding recommendation.  The patient was not physically examined nor present for their treatment options. Therefore, final treatment plans cannot be decided.

## 2019-04-23 ENCOUNTER — Telehealth: Payer: Self-pay | Admitting: Pulmonary Disease

## 2019-04-23 NOTE — Telephone Encounter (Signed)
PCCM:  I called and spoke with the patients husband.   They did pick up the decadron from the pharmacy and started it the other day.   They have not received any communication about appt with oncology or radiation oncology in [place].   Our office will follow up tomorrow morning. We need to make sure she has urgent appt with radiation oncology in [place] as well as medical oncology. I have spoke with Dr. Lavera Guise last week.   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   CC: PCCs, Daneil Dan and Larene Beach in [facility] 04/23/2019 8:11 AM

## 2019-04-24 NOTE — Telephone Encounter (Signed)
I spoke to [facility] w/ Dr. Bobby Rumpf' office.  She has tried repeatedly to reach the patient, but the patient does not answer the phone & the VM is full so she has not been able to LM for the patient.  She mailed a letter on 3/18 to the patient to contact her at [phone] ext [phone].

## 2019-04-24 NOTE — Telephone Encounter (Addendum)
Called and spoke to pt's husband, Simona Huh. Informed him of the referral and Dr.Lewis' office trying to get in touch with them. Provided Barbara's number to schedule appt. Simona Huh is aware to contact this number to schedule visit and to call our office back if there are any issues trying to make the appt.   Will forward to [facility] and Dr. Valeta Harms as Juluis Rainier.

## 2019-04-24 NOTE — Telephone Encounter (Signed)
Oncology referral was placed on 04/05/2019.   PCC's advise about pt's appt. She needs one asap.

## 2019-04-24 NOTE — Telephone Encounter (Signed)
Please see phone note from 04/23/19. Will sign off.

## 2019-04-24 NOTE — Telephone Encounter (Signed)
Great thanks Garner Nash, DO [facility] 04/24/2019 3:48 PM

## 2019-04-24 NOTE — Telephone Encounter (Signed)
Called and spoke to [place] with Dr. Bobby Rumpf' office. Pt has an appt with Dr. Bobby Rumpf (medical oncology) tomorrow 3.23.21 and an appt with Dr. Orlene Erm (radiation oncology) on Wednesday 3.24.21.   Will forward to Dr. Valeta Harms as Juluis Rainier.

## 2019-04-24 NOTE — Telephone Encounter (Signed)
I had to leave a message for their referral coordinator, Pamala Hurry, to return my call with an update.

## 2019-04-25 ENCOUNTER — Ambulatory Visit (HOSPITAL_COMMUNITY)
Admission: RE | Admit: 2019-04-25 | Payer: BLUE CROSS/BLUE SHIELD | Source: Home / Self Care | Admitting: Pulmonary Disease

## 2019-04-25 ENCOUNTER — Encounter (HOSPITAL_COMMUNITY): Admission: RE | Payer: Self-pay | Source: Home / Self Care

## 2019-04-25 DIAGNOSIS — C3490 Malignant neoplasm of unspecified part of unspecified bronchus or lung: Secondary | ICD-10-CM | POA: Diagnosis not present

## 2019-04-25 DIAGNOSIS — C7951 Secondary malignant neoplasm of bone: Secondary | ICD-10-CM

## 2019-04-25 SURGERY — VIDEO BRONCHOSCOPY WITHOUT FLUORO
Anesthesia: Monitor Anesthesia Care

## 2019-04-27 ENCOUNTER — Other Ambulatory Visit: Payer: Self-pay | Admitting: *Deleted

## 2019-04-27 NOTE — Progress Notes (Signed)
The proposed treatment discussed in cancer conference 04/27/19 is for discussion purpose only and is not a binding recommendation.  The patient was not physically examined nor present for their treatment options.  Therefore, final treatment plans cannot be decided.   Appt Dr. Bobby Rumpf 04/25/19

## 2019-05-07 DIAGNOSIS — R739 Hyperglycemia, unspecified: Secondary | ICD-10-CM | POA: Diagnosis not present

## 2019-05-07 DIAGNOSIS — I2699 Other pulmonary embolism without acute cor pulmonale: Secondary | ICD-10-CM | POA: Diagnosis not present

## 2019-05-07 DIAGNOSIS — R0789 Other chest pain: Secondary | ICD-10-CM | POA: Diagnosis not present

## 2019-05-07 DIAGNOSIS — D61818 Other pancytopenia: Secondary | ICD-10-CM | POA: Diagnosis not present

## 2019-05-07 DIAGNOSIS — D72825 Bandemia: Secondary | ICD-10-CM

## 2019-05-08 DIAGNOSIS — R0789 Other chest pain: Secondary | ICD-10-CM | POA: Diagnosis not present

## 2019-05-08 DIAGNOSIS — I361 Nonrheumatic tricuspid (valve) insufficiency: Secondary | ICD-10-CM

## 2019-05-08 DIAGNOSIS — D72825 Bandemia: Secondary | ICD-10-CM | POA: Diagnosis not present

## 2019-05-08 DIAGNOSIS — R739 Hyperglycemia, unspecified: Secondary | ICD-10-CM | POA: Diagnosis not present

## 2019-05-08 DIAGNOSIS — D61818 Other pancytopenia: Secondary | ICD-10-CM | POA: Diagnosis not present

## 2019-05-09 DIAGNOSIS — R739 Hyperglycemia, unspecified: Secondary | ICD-10-CM | POA: Diagnosis not present

## 2019-05-09 DIAGNOSIS — D72825 Bandemia: Secondary | ICD-10-CM | POA: Diagnosis not present

## 2019-05-09 DIAGNOSIS — D61818 Other pancytopenia: Secondary | ICD-10-CM | POA: Diagnosis not present

## 2019-05-09 DIAGNOSIS — C3490 Malignant neoplasm of unspecified part of unspecified bronchus or lung: Secondary | ICD-10-CM

## 2019-05-09 DIAGNOSIS — R0789 Other chest pain: Secondary | ICD-10-CM | POA: Diagnosis not present

## 2019-05-10 DIAGNOSIS — D72825 Bandemia: Secondary | ICD-10-CM | POA: Diagnosis not present

## 2019-05-10 DIAGNOSIS — R739 Hyperglycemia, unspecified: Secondary | ICD-10-CM | POA: Diagnosis not present

## 2019-05-10 DIAGNOSIS — D61818 Other pancytopenia: Secondary | ICD-10-CM | POA: Diagnosis not present

## 2019-05-10 DIAGNOSIS — R0789 Other chest pain: Secondary | ICD-10-CM | POA: Diagnosis not present

## 2019-05-11 DIAGNOSIS — D61818 Other pancytopenia: Secondary | ICD-10-CM | POA: Diagnosis not present

## 2019-05-11 DIAGNOSIS — R739 Hyperglycemia, unspecified: Secondary | ICD-10-CM | POA: Diagnosis not present

## 2019-05-11 DIAGNOSIS — D72825 Bandemia: Secondary | ICD-10-CM | POA: Diagnosis not present

## 2019-05-11 DIAGNOSIS — R0789 Other chest pain: Secondary | ICD-10-CM | POA: Diagnosis not present

## 2019-05-12 DIAGNOSIS — D61818 Other pancytopenia: Secondary | ICD-10-CM | POA: Diagnosis not present

## 2019-05-12 DIAGNOSIS — D72825 Bandemia: Secondary | ICD-10-CM | POA: Diagnosis not present

## 2019-05-12 DIAGNOSIS — R739 Hyperglycemia, unspecified: Secondary | ICD-10-CM | POA: Diagnosis not present

## 2019-05-12 DIAGNOSIS — R0789 Other chest pain: Secondary | ICD-10-CM | POA: Diagnosis not present

## 2019-05-17 ENCOUNTER — Ambulatory Visit: Payer: BLUE CROSS/BLUE SHIELD | Admitting: Pulmonary Disease

## 2019-05-25 DIAGNOSIS — E1110 Type 2 diabetes mellitus with ketoacidosis without coma: Secondary | ICD-10-CM | POA: Diagnosis not present

## 2019-05-25 DIAGNOSIS — R0902 Hypoxemia: Secondary | ICD-10-CM | POA: Diagnosis not present

## 2019-05-25 DIAGNOSIS — C7931 Secondary malignant neoplasm of brain: Secondary | ICD-10-CM

## 2019-05-25 DIAGNOSIS — J9600 Acute respiratory failure, unspecified whether with hypoxia or hypercapnia: Secondary | ICD-10-CM | POA: Diagnosis not present

## 2019-05-25 DIAGNOSIS — C3490 Malignant neoplasm of unspecified part of unspecified bronchus or lung: Secondary | ICD-10-CM

## 2019-05-25 DIAGNOSIS — A419 Sepsis, unspecified organism: Secondary | ICD-10-CM

## 2019-05-25 DIAGNOSIS — J811 Chronic pulmonary edema: Secondary | ICD-10-CM | POA: Diagnosis not present

## 2019-05-25 DIAGNOSIS — I21A1 Myocardial infarction type 2: Secondary | ICD-10-CM | POA: Diagnosis not present

## 2019-05-25 DIAGNOSIS — I2699 Other pulmonary embolism without acute cor pulmonale: Secondary | ICD-10-CM | POA: Diagnosis not present

## 2019-05-26 DIAGNOSIS — R0989 Other specified symptoms and signs involving the circulatory and respiratory systems: Secondary | ICD-10-CM | POA: Diagnosis not present

## 2019-05-26 DIAGNOSIS — I340 Nonrheumatic mitral (valve) insufficiency: Secondary | ICD-10-CM

## 2019-05-26 DIAGNOSIS — R931 Abnormal findings on diagnostic imaging of heart and coronary circulation: Secondary | ICD-10-CM | POA: Diagnosis not present

## 2019-05-26 DIAGNOSIS — J9600 Acute respiratory failure, unspecified whether with hypoxia or hypercapnia: Secondary | ICD-10-CM | POA: Diagnosis not present

## 2019-05-26 DIAGNOSIS — R0902 Hypoxemia: Secondary | ICD-10-CM | POA: Diagnosis not present

## 2019-05-26 DIAGNOSIS — E1110 Type 2 diabetes mellitus with ketoacidosis without coma: Secondary | ICD-10-CM | POA: Diagnosis not present

## 2019-05-26 DIAGNOSIS — J811 Chronic pulmonary edema: Secondary | ICD-10-CM | POA: Diagnosis not present

## 2019-05-26 DIAGNOSIS — I361 Nonrheumatic tricuspid (valve) insufficiency: Secondary | ICD-10-CM

## 2019-05-26 DIAGNOSIS — I21A1 Myocardial infarction type 2: Secondary | ICD-10-CM | POA: Diagnosis not present

## 2019-05-26 DIAGNOSIS — J9584 Transfusion-related acute lung injury (TRALI): Secondary | ICD-10-CM

## 2019-05-27 DIAGNOSIS — E1110 Type 2 diabetes mellitus with ketoacidosis without coma: Secondary | ICD-10-CM | POA: Diagnosis not present

## 2019-05-27 DIAGNOSIS — R0602 Shortness of breath: Secondary | ICD-10-CM | POA: Diagnosis not present

## 2019-05-27 DIAGNOSIS — J9600 Acute respiratory failure, unspecified whether with hypoxia or hypercapnia: Secondary | ICD-10-CM | POA: Diagnosis not present

## 2019-05-27 DIAGNOSIS — R0902 Hypoxemia: Secondary | ICD-10-CM | POA: Diagnosis not present

## 2019-05-27 DIAGNOSIS — J811 Chronic pulmonary edema: Secondary | ICD-10-CM | POA: Diagnosis not present

## 2019-05-28 DIAGNOSIS — R0902 Hypoxemia: Secondary | ICD-10-CM | POA: Diagnosis not present

## 2019-05-28 DIAGNOSIS — J811 Chronic pulmonary edema: Secondary | ICD-10-CM | POA: Diagnosis not present

## 2019-05-28 DIAGNOSIS — J9600 Acute respiratory failure, unspecified whether with hypoxia or hypercapnia: Secondary | ICD-10-CM | POA: Diagnosis not present

## 2019-05-28 DIAGNOSIS — E1110 Type 2 diabetes mellitus with ketoacidosis without coma: Secondary | ICD-10-CM | POA: Diagnosis not present

## 2019-05-29 DIAGNOSIS — E1110 Type 2 diabetes mellitus with ketoacidosis without coma: Secondary | ICD-10-CM | POA: Diagnosis not present

## 2019-05-29 DIAGNOSIS — J9600 Acute respiratory failure, unspecified whether with hypoxia or hypercapnia: Secondary | ICD-10-CM | POA: Diagnosis not present

## 2019-05-29 DIAGNOSIS — J811 Chronic pulmonary edema: Secondary | ICD-10-CM | POA: Diagnosis not present

## 2019-05-29 DIAGNOSIS — R0902 Hypoxemia: Secondary | ICD-10-CM | POA: Diagnosis not present

## 2019-07-04 DEATH — deceased
